# Patient Record
Sex: Male | Born: 1950 | Race: White | Hispanic: No | Marital: Married
Health system: Southern US, Community
[De-identification: ages and names within clinical notes are randomized; demographics above are authoritative.]

---

## 2016-07-01 ENCOUNTER — Inpatient Hospital Stay
Admission: AD | Admit: 2016-07-01 | Discharge: 2016-08-08 | Disposition: A | Discharge status: Rehabilitation Facility | Payer: Self-pay | Source: Other Acute Inpatient Hospital | Attending: Internal Medicine | Admitting: Internal Medicine

## 2016-07-01 ENCOUNTER — Other Ambulatory Visit (HOSPITAL_COMMUNITY): Payer: Self-pay

## 2016-07-01 DIAGNOSIS — J9690 Respiratory failure, unspecified, unspecified whether with hypoxia or hypercapnia: Secondary | ICD-10-CM

## 2016-07-01 DIAGNOSIS — Z4659 Encounter for fitting and adjustment of other gastrointestinal appliance and device: Secondary | ICD-10-CM

## 2016-07-01 DIAGNOSIS — Z0189 Encounter for other specified special examinations: Secondary | ICD-10-CM

## 2016-07-02 ENCOUNTER — Other Ambulatory Visit (HOSPITAL_COMMUNITY): Payer: Self-pay

## 2016-07-02 LAB — BLOOD GAS, ARTERIAL
Acid-base deficit: 3.1 mmol/L — ABNORMAL HIGH (ref 0.0–2.0)
BICARBONATE: 22.2 mmol/L (ref 20.0–28.0)
Drawn by: 244851
FIO2: 30
LHR: 16 {breaths}/min
O2 SAT: 95.4 %
PEEP/CPAP: 8 cmH2O
PH ART: 7.313 — AB (ref 7.350–7.450)
Patient temperature: 98.6
Pressure control: 20 cmH2O
pCO2 arterial: 45.1 mmHg (ref 32.0–48.0)
pO2, Arterial: 81.1 mmHg — ABNORMAL LOW (ref 83.0–108.0)

## 2016-07-02 LAB — COMPREHENSIVE METABOLIC PANEL
ALBUMIN: 2.2 g/dL — AB (ref 3.5–5.0)
ALK PHOS: 39 U/L (ref 38–126)
ALT: 29 U/L (ref 17–63)
ALT: 28 U/L (ref 17–63)
AST: 23 U/L (ref 15–41)
AST: 23 U/L (ref 15–41)
Albumin: 2.2 g/dL — ABNORMAL LOW (ref 3.5–5.0)
Alkaline Phosphatase: 37 U/L — ABNORMAL LOW (ref 38–126)
Anion gap: 5 (ref 5–15)
Anion gap: 6 (ref 5–15)
BILIRUBIN TOTAL: 0.6 mg/dL (ref 0.3–1.2)
BILIRUBIN TOTAL: 0.5 mg/dL (ref 0.3–1.2)
BUN: 31 mg/dL — AB (ref 6–20)
BUN: 28 mg/dL — AB (ref 6–20)
CALCIUM: 7.8 mg/dL — AB (ref 8.9–10.3)
CO2: 22 mmol/L (ref 22–32)
CO2: 21 mmol/L — ABNORMAL LOW (ref 22–32)
CREATININE: 0.73 mg/dL (ref 0.61–1.24)
Calcium: 7.8 mg/dL — ABNORMAL LOW (ref 8.9–10.3)
Chloride: 113 mmol/L — ABNORMAL HIGH (ref 101–111)
Chloride: 113 mmol/L — ABNORMAL HIGH (ref 101–111)
Creatinine, Ser: 0.75 mg/dL (ref 0.61–1.24)
GFR calc Af Amer: >60 mL/min (ref >60)
GFR calc Af Amer: >60 mL/min (ref >60)
GLUCOSE: 281 mg/dL — AB (ref 65–99)
Glucose, Bld: 266 mg/dL — ABNORMAL HIGH (ref 65–99)
POTASSIUM: 4.7 mmol/L (ref 3.5–5.1)
Potassium: 4.9 mmol/L (ref 3.5–5.1)
Sodium: 140 mmol/L (ref 135–145)
Sodium: 140 mmol/L (ref 135–145)
TOTAL PROTEIN: 4.7 g/dL — AB (ref 6.5–8.1)
TOTAL PROTEIN: 4.7 g/dL — AB (ref 6.5–8.1)

## 2016-07-02 LAB — PROTIME-INR
INR: 1.13
INR: 1.18
PROTHROMBIN TIME: 14.6 s (ref 11.4–15.2)
Prothrombin Time: 15.1 seconds (ref 11.4–15.2)

## 2016-07-02 LAB — CBC
HEMATOCRIT: 26.5 % — AB (ref 39.0–52.0)
Hemoglobin: 7.7 g/dL — ABNORMAL LOW (ref 13.0–17.0)
MCH: 24.8 pg — AB (ref 26.0–34.0)
MCHC: 29.1 g/dL — AB (ref 30.0–36.0)
MCV: 85.2 fL (ref 78.0–100.0)
PLATELETS: 119 10*3/uL — AB (ref 150–400)
RBC: 3.11 MIL/uL — ABNORMAL LOW (ref 4.22–5.81)
RDW: 17.6 % — AB (ref 11.5–15.5)
WBC: 10.8 10*3/uL — AB (ref 4.0–10.5)

## 2016-07-02 LAB — TSH: TSH: 1.643 u[IU]/mL (ref 0.350–4.500)

## 2016-07-04 ENCOUNTER — Other Ambulatory Visit (HOSPITAL_COMMUNITY): Payer: Self-pay

## 2016-07-06 ENCOUNTER — Encounter
Admission: AD | Disposition: A | Discharge status: Rehabilitation Facility | Payer: Self-pay | Attending: Internal Medicine

## 2016-07-06 ENCOUNTER — Encounter (HOSPITAL_COMMUNITY): Payer: Self-pay | Admitting: Anesthesiology

## 2016-07-06 ENCOUNTER — Other Ambulatory Visit (HOSPITAL_COMMUNITY): Payer: Self-pay

## 2016-07-06 HISTORY — PX: TRACHEOSTOMY TUBE PLACEMENT: SHX814

## 2016-07-06 SURGERY — CREATION, TRACHEOSTOMY
Anesthesia: General | Site: Neck

## 2016-07-06 MED ORDER — LIDOCAINE HCL (CARDIAC) 20 MG/ML IV SOLN
INTRAVENOUS | Status: DC | PRN
  Administered 2016-07-06: 60 mg via INTRAVENOUS

## 2016-07-06 MED ORDER — LACTATED RINGERS IV SOLN
INTRAVENOUS | Status: DC | PRN
  Administered 2016-07-06: 14:00:00 via INTRAVENOUS

## 2016-07-06 MED ORDER — 0.9 % SODIUM CHLORIDE (POUR BTL) OPTIME
TOPICAL | Status: DC | PRN
  Administered 2016-07-06: 1000 mL

## 2016-07-06 MED ORDER — FENTANYL CITRATE (PF) 250 MCG/5ML IJ SOLN
INTRAMUSCULAR | Status: AC
  Filled 2016-07-06: qty 5

## 2016-07-06 MED ORDER — MIDAZOLAM HCL 2 MG/2ML IJ SOLN
INTRAMUSCULAR | Status: AC
Start: 2016-07-06 — End: 2016-07-06
  Filled 2016-07-06: qty 2

## 2016-07-06 MED ORDER — LIDOCAINE-EPINEPHRINE 1 %-1:100000 IJ SOLN
INTRAMUSCULAR | Status: DC | PRN
  Administered 2016-07-06: 7 mL

## 2016-07-06 MED ORDER — PROPOFOL 10 MG/ML IV BOLUS
INTRAVENOUS | Status: AC
  Filled 2016-07-06: qty 20

## 2016-07-06 MED ORDER — PROPOFOL 10 MG/ML IV BOLUS
INTRAVENOUS | Status: DC | PRN
  Administered 2016-07-06: 40 mg via INTRAVENOUS

## 2016-07-06 MED ORDER — ROCURONIUM BROMIDE 100 MG/10ML IV SOLN
INTRAVENOUS | Status: DC | PRN
  Administered 2016-07-06: 20 mg via INTRAVENOUS

## 2016-07-06 MED ORDER — FENTANYL CITRATE (PF) 100 MCG/2ML IJ SOLN
INTRAMUSCULAR | Status: DC | PRN
  Administered 2016-07-06 (×2): 50 ug via INTRAVENOUS

## 2016-07-06 SURGICAL SUPPLY — 40 items
ATTRACTOMAT 16X20 MAGNETIC DRP (DRAPES) IMPLANT
BLADE SURG 15 STRL LF DISP TIS (BLADE) ×1 IMPLANT
BLADE SURG 15 STRL SS (BLADE) ×2
CLEANER TIP ELECTROSURG 2X2 (MISCELLANEOUS) ×3 IMPLANT
COVER SURGICAL LIGHT HANDLE (MISCELLANEOUS) ×3 IMPLANT
DRAPE HALF SHEET 40X57 (DRAPES) IMPLANT
ELECT COATED BLADE 2.86 ST (ELECTRODE) ×3 IMPLANT
ELECT REM PT RETURN 9FT ADLT (ELECTROSURGICAL) ×3
ELECTRODE REM PT RTRN 9FT ADLT (ELECTROSURGICAL) ×1 IMPLANT
GAUZE SPONGE 4X4 16PLY XRAY LF (GAUZE/BANDAGES/DRESSINGS) ×3 IMPLANT
GEL ULTRASOUND 20GR AQUASONIC (MISCELLANEOUS) ×3 IMPLANT
GLOVE SS BIOGEL STRL SZ 7.5 (GLOVE) ×1 IMPLANT
GLOVE SUPERSENSE BIOGEL SZ 7.5 (GLOVE) ×2
GOWN STRL REUS W/ TWL LRG LVL3 (GOWN DISPOSABLE) ×1 IMPLANT
GOWN STRL REUS W/ TWL XL LVL3 (GOWN DISPOSABLE) ×1 IMPLANT
GOWN STRL REUS W/TWL LRG LVL3 (GOWN DISPOSABLE) ×2
GOWN STRL REUS W/TWL XL LVL3 (GOWN DISPOSABLE) ×2
HOLDER TRACH TUBE VELCRO 19.5 (MISCELLANEOUS) ×3 IMPLANT
KIT BASIN OR (CUSTOM PROCEDURE TRAY) ×3 IMPLANT
KIT ROOM TURNOVER OR (KITS) ×3 IMPLANT
KIT SUCTION CATH 14FR (SUCTIONS) ×3 IMPLANT
NEEDLE HYPO 25GX1X1/2 BEV (NEEDLE) IMPLANT
NS IRRIG 1000ML POUR BTL (IV SOLUTION) ×3 IMPLANT
PACK EENT II TURBAN DRAPE (CUSTOM PROCEDURE TRAY) ×3 IMPLANT
PAD ARMBOARD 7.5X6 YLW CONV (MISCELLANEOUS) ×6 IMPLANT
PENCIL BUTTON HOLSTER BLD 10FT (ELECTRODE) ×3 IMPLANT
SPONGE DRAIN TRACH 4X4 STRL 2S (GAUZE/BANDAGES/DRESSINGS) ×3 IMPLANT
SPONGE INTESTINAL PEANUT (DISPOSABLE) ×3 IMPLANT
SUT SILK 2 0 SH CR/8 (SUTURE) ×3 IMPLANT
SUT SILK 3 0 TIES 10X30 (SUTURE) IMPLANT
SYR 5ML LUER SLIP (SYRINGE) ×3 IMPLANT
SYR CONTROL 10ML LL (SYRINGE) ×3 IMPLANT
TOWEL OR 17X24 6PK STRL BLUE (TOWEL DISPOSABLE) ×3 IMPLANT
TOWEL OR 17X26 10 PK STRL BLUE (TOWEL DISPOSABLE) ×3 IMPLANT
TUBE CONNECTING 12'X1/4 (SUCTIONS) ×1
TUBE CONNECTING 12X1/4 (SUCTIONS) ×2 IMPLANT
TUBE TRACH 7.0 EXL PROX CUF (TUBING) ×3 IMPLANT
TUBE TRACH SHILEY  6 DIST  CUF (TUBING) IMPLANT
TUBE TRACH SHILEY 10 DIST CUFF (TUBING) IMPLANT
TUBE TRACH SHILEY 8 DIST CUF (TUBING) IMPLANT

## 2016-07-06 NOTE — H&P (Signed)
PREOPERATIVE H&P  Chief Complaint: respiratory failure  HPI: Joe Norton is a 66 y.o. male who presents for evaluation of respiratory failure. Patient admitted to outside hospital in respiratory failure and CHF. Patient with history of COPD, OSA,and alcoholism. Patient was subsequently intubated over 2 weeks ago. He failed extubation x 2. Last reintubation was 11 days ago. He was subsequently transferred to [facility] 5 days ago intubated on vent. It was recommended he have a tracheostomy placed.  No past medical history on file. No past surgical history on file. Social History   Social History  . Marital status: Married    Spouse name: N/A  . Number of children: N/A  . Years of education: N/A   Social History Main Topics  . Smoking status: Not on file  . Smokeless tobacco: Not on file  . Alcohol use Not on file  . Drug use: Unknown  . Sexual activity: Not on file   Other Topics Concern  . Not on file   Social History Narrative  . No narrative on file   No family history on file. Allergies  Allergen Reactions  . Tiotropium Hives and Rash  . Tiotropium Bromide Monohydrate Hives and Rash   Prior to Admission medications   Not on File     Positive ROS: intubated on vent  All other systems have been reviewed and were otherwise negative with the exception of those mentioned in the HPI and as above.  Physical Exam: There were no vitals filed for this visit.  General: Intubated on vent. Discussed trach with patient Oral: Intubated Nasal: Clear nasal passages Neck: No palpable adenopathy or thyroid nodules. Obese neck with trach midline Cardiovascular: Regular rate and rhythm, no murmur.  Respiratory: Clear to auscultation Neurologic: Alert    Assessment/Plan: RESPIRATORY FAILURE Plan for Procedure(s): TRACHEOSTOMY   Dillard Cannon, MD 07/06/2016 1:27 PM

## 2016-07-06 NOTE — Brief Op Note (Signed)
07/01/2016 - 07/06/2016  2:34 PM  PATIENT:  Joe Norton  66 y.o. male  PRE-OPERATIVE DIAGNOSIS:  RESPIRATORY FAILURE  POST-OPERATIVE DIAGNOSIS:  RESPIRATORY FAILURE  PROCEDURE:  Procedure(s): TRACHEOSTOMY (N/A) 70 XLT tracheostomy tube  SURGEON:  Surgeon(s) and Role:    * Drema Halon, MD - Primary  PHYSICIAN ASSISTANT:   ASSISTANTS: none   ANESTHESIA:   general  EBL:  No intake/output data recorded.  BLOOD ADMINISTERED:none  DRAINS: none   LOCAL MEDICATIONS USED:  XYLOCAINE with EPI 6 cc  SPECIMEN:  No Specimen  DISPOSITION OF SPECIMEN:  N/A  COUNTS:  YES  TOURNIQUET:  * No tourniquets in log *  DICTATION: .Other Dictation: Dictation Number [id number]  PLAN OF CARE: Discharge to home after PACU  PATIENT DISPOSITION:  PACU - hemodynamically stable.   Delay start of Pharmacological VTE agent (>24hrs) due to surgical blood loss or risk of bleeding: yes

## 2016-07-06 NOTE — Interval H&P Note (Signed)
History and Physical Interval Note:  07/06/2016 1:37 PM  Joe Norton  has presented today for surgery, with the diagnosis of RESPIRATORY FAILURE  The various methods of treatment have been discussed with the patient and family. After consideration of risks, benefits and other options for treatment, the patient has consented to  Procedure(s): TRACHEOSTOMY (N/A) as a surgical intervention .  The patient's history has been reviewed, patient examined, no change in status, stable for surgery.  I have reviewed the patient's chart and labs.  Questions were answered to the patient's satisfaction.     Haruko Mersch

## 2016-07-06 NOTE — Anesthesia Preprocedure Evaluation (Signed)
Anesthesia Evaluation  Patient identified by MRN, date of birth, ID band Patient unresponsive    Reviewed: NPO status , Unable to perform ROS - Chart review only  Airway Mallampati: Intubated       Dental no notable dental hx.    Pulmonary COPD,  VDRF   + rhonchi  + decreased breath sounds      Cardiovascular + CAD, + CABG and +CHF   Rhythm:Irregular Rate:Tachycardia     Neuro/Psych    GI/Hepatic   Endo/Other  Morbid obesity  Renal/GU      Musculoskeletal   Abdominal (+) + obese,   Peds  Hematology   Anesthesia Other Findings   Reproductive/Obstetrics                             Anesthesia Physical Anesthesia Plan  ASA: IV  Anesthesia Plan: General   Post-op Pain Management:    Induction: Intravenous  Airway Management Planned: Tracheostomy  Additional Equipment:   Intra-op Plan:   Post-operative Plan: Post-operative intubation/ventilation  Informed Consent: I have reviewed the patients History and Physical, chart, labs and discussed the procedure including the risks, benefits and alternatives for the proposed anesthesia with the patient or authorized representative who has indicated his/her understanding and acceptance.   History available from chart only  Plan Discussed with:   Anesthesia Plan Comments:         Anesthesia Quick Evaluation

## 2016-07-06 NOTE — Anesthesia Procedure Notes (Signed)
Date/Time: 07/06/2016 1:52 PM Performed by: Fransisca Kaufmann Pre-anesthesia Checklist: Patient identified, Emergency Drugs available, Suction available and Timeout performed Patient Re-evaluated:Patient Re-evaluated prior to inductionOxygen Delivery Method: Circle system utilized Intubation Type: IV induction and Tracheostomy Comments: Anesthesia circuit attached to ETT per Dr Jacklynn Bue

## 2016-07-07 ENCOUNTER — Encounter (HOSPITAL_COMMUNITY): Payer: Self-pay | Admitting: Otolaryngology

## 2016-07-07 ENCOUNTER — Encounter (HOSPITAL_BASED_OUTPATIENT_CLINIC_OR_DEPARTMENT_OTHER): Payer: Self-pay

## 2016-07-07 DIAGNOSIS — M7989 Other specified soft tissue disorders: Secondary | ICD-10-CM

## 2016-07-07 NOTE — Progress Notes (Signed)
VASCULAR LAB PRELIMINARY  PRELIMINARY  PRELIMINARY  PRELIMINARY  Right upper extremity venous duplex completed.    Preliminary report:  There is no DVT or SVT noted in the right upper extremity.   Reegan Mctighe, RVT 07/07/2016, 1:58 PM

## 2016-07-07 NOTE — Op Note (Signed)
NAME:  Joe Norton, Joe Norton                  ACCOUNT NO.:  MEDICAL RECORD NO.:  [id number]  LOCATION:                                 FACILITY:  PHYSICIAN:  Kristine Garbe. Ezzard Standing, M.D. DATE OF BIRTH:  DATE OF PROCEDURE:  07/06/2016 DATE OF DISCHARGE:                              OPERATIVE REPORT   PREOPERATIVE DIAGNOSIS:  Acute respiratory failure on ventilator.  POSTOPERATIVE DIAGNOSIS:  Acute respiratory failure on ventilator.  OPERATION PERFORMED:  Tracheostomy with a 70 Proximal XLT tracheostomy tube.  SURGEON:  Kristine Garbe. Ezzard Standing, M.D.  ANESTHESIA:  General endotracheal.  COMPLICATIONS:  None.  BRIEF CLINICAL NOTE:  Joe Norton is a 66 year old gentleman with history of congestive heart failure, COPD, obstructive sleep apnea, hypertension, and history of alcoholism.  He was admitted to an outside hospital about 3 weeks ago in respiratory distress and was intubated. Two attempts of extubation were unsuccessful requiring last intubation about 10 days ago.  The patient was subsequently transferred to [facility] 4 days ago, intubated on ventilator.  The patient is very obese with a very thick neck, history of obstructive sleep apnea, as well as COPD and congestive heart failure.  It was recommended that he undergo tracheotomy.  He was taken to the operating room this time for tracheostomy.  DESCRIPTION OF PROCEDURE:  After adequate endotracheal anesthesia and IV sedation, the neck was extended, prepped with Betadine solution, and draped out with sterile towels.  The proposed incision site was injected with 6 mL of Xylocaine with epinephrine for local anesthetic and hemostasis.  A vertical incision was then made just above the suprasternal notch down through the subcutaneous tissue, the patient has significant amount of subcutaneous fat that was cauterized.  The strap muscles were divided and retracted in midline.  Dissection was carried down to the  cricoid cartilage.  This was fairly low in the neck.  The first 2 tracheal rings were exposed with retraction and cautery and the upper portion of the thyroid isthmus was partly cauterized.  A horizontal incision was made between the first and second tracheal rings.  The endotracheal tube was removed and a 70 proximal XLT tracheostomy tube was inserted without any difficulty.  The patient was ventilated well.  This was secured with 2-0 silk sutures x4 and Velcro trach collar around the neck.  The patient was subsequently transferred back to [facility].          ______________________________ Kristine Garbe. Ezzard Standing, M.D.     CEN/MEDQ  D:  07/06/2016  T:  07/06/2016  Job:  [id number]

## 2016-07-08 ENCOUNTER — Encounter (HOSPITAL_COMMUNITY): Payer: Self-pay | Admitting: Otolaryngology

## 2016-07-08 NOTE — Anesthesia Postprocedure Evaluation (Addendum)
Anesthesia Post Note  Patient: Joe Norton  Procedure(s) Performed: Procedure(s) (LRB): TRACHEOSTOMY (N/A)  Patient location during evaluation: PACU Anesthesia Type: General Level of consciousness: sedated and patient remains intubated per anesthesia plan Pain management: pain level controlled Vital Signs Assessment: post-procedure vital signs reviewed and stable Respiratory status: spontaneous breathing, nonlabored ventilation, respiratory function stable and patient connected to nasal cannula oxygen Cardiovascular status: blood pressure returned to baseline and stable Postop Assessment: no signs of nausea or vomiting Anesthetic complications: no       Last Vitals: There were no vitals filed for this visit.  Last Pain: There were no vitals filed for this visit.               Harding Thomure,JAMES TERRILL

## 2016-07-08 NOTE — Anesthesia Postprocedure Evaluation (Signed)
Anesthesia Post Note  Patient: Joe Norton  Procedure(s) Performed: Procedure(s) (LRB): TRACHEOSTOMY (N/A)  Anesthesia Post Evaluation     Last Vitals: There were no vitals filed for this visit.  Last Pain: There were no vitals filed for this visit.               Alvan Dame

## 2016-07-08 NOTE — Transfer of Care (Signed)
Immediate Anesthesia Transfer of Care Note  Patient: Joe Norton  Procedure(s) Performed: Procedure(s): TRACHEOSTOMY (N/A)  Patient Location: PACU and Nursing Unit  Anesthesia Type:General  Level of Consciousness: awake and alert   Airway & Oxygen Therapy: Patient Spontanous Breathing and Patient connected to tracheostomy mask oxygen  Post-op Assessment: Report given to RN, Post -op Vital signs reviewed and stable and Patient moving all extremities  Post vital signs: Reviewed and stable  Last Vitals: There were no vitals filed for this visit.  Last Pain: There were no vitals filed for this visit.       Complications: No apparent anesthesia complications

## 2016-07-09 LAB — MAGNESIUM: MAGNESIUM: 1.5 mg/dL — AB (ref 1.7–2.4)

## 2016-07-09 LAB — BASIC METABOLIC PANEL
ANION GAP: 7 (ref 5–15)
BUN: 29 mg/dL — ABNORMAL HIGH (ref 6–20)
CHLORIDE: 105 mmol/L (ref 101–111)
CO2: 23 mmol/L (ref 22–32)
Calcium: 7.2 mg/dL — ABNORMAL LOW (ref 8.9–10.3)
Creatinine, Ser: 0.72 mg/dL (ref 0.61–1.24)
GFR calc Af Amer: >60 mL/min (ref >60)
GFR calc non Af Amer: >60 mL/min (ref >60)
GLUCOSE: 275 mg/dL — AB (ref 65–99)
POTASSIUM: 4.9 mmol/L (ref 3.5–5.1)
Sodium: 135 mmol/L (ref 135–145)

## 2016-07-10 LAB — BASIC METABOLIC PANEL
Anion gap: 8 (ref 5–15)
BUN: 29 mg/dL — AB (ref 6–20)
CHLORIDE: 104 mmol/L (ref 101–111)
CO2: 23 mmol/L (ref 22–32)
CREATININE: 0.56 mg/dL — AB (ref 0.61–1.24)
Calcium: 6.7 mg/dL — ABNORMAL LOW (ref 8.9–10.3)
GFR calc Af Amer: >60 mL/min (ref >60)
GFR calc non Af Amer: >60 mL/min (ref >60)
Glucose, Bld: 205 mg/dL — ABNORMAL HIGH (ref 65–99)
Potassium: 4.9 mmol/L (ref 3.5–5.1)
SODIUM: 135 mmol/L (ref 135–145)

## 2016-07-10 LAB — MAGNESIUM: MAGNESIUM: 1.8 mg/dL (ref 1.7–2.4)

## 2016-07-11 LAB — HEMOGLOBIN A1C
HEMOGLOBIN A1C: 6.8 % — AB (ref 4.8–5.6)
Mean Plasma Glucose: 148 mg/dL

## 2016-07-14 ENCOUNTER — Other Ambulatory Visit (HOSPITAL_COMMUNITY): Payer: Self-pay

## 2016-07-15 LAB — BASIC METABOLIC PANEL
Anion gap: 8 (ref 5–15)
BUN: 21 mg/dL — ABNORMAL HIGH (ref 6–20)
CALCIUM: 7.1 mg/dL — AB (ref 8.9–10.3)
CHLORIDE: 100 mmol/L — AB (ref 101–111)
CO2: 32 mmol/L (ref 22–32)
Creatinine, Ser: 0.47 mg/dL — ABNORMAL LOW (ref 0.61–1.24)
GFR calc non Af Amer: >60 mL/min (ref >60)
Glucose, Bld: 124 mg/dL — ABNORMAL HIGH (ref 65–99)
Potassium: 3.5 mmol/L (ref 3.5–5.1)
Sodium: 140 mmol/L (ref 135–145)

## 2016-07-15 LAB — CBC
HEMATOCRIT: 30.1 % — AB (ref 39.0–52.0)
HEMOGLOBIN: 8.8 g/dL — AB (ref 13.0–17.0)
MCH: 25.1 pg — ABNORMAL LOW (ref 26.0–34.0)
MCHC: 29.2 g/dL — ABNORMAL LOW (ref 30.0–36.0)
MCV: 86 fL (ref 78.0–100.0)
Platelets: 230 10*3/uL (ref 150–400)
RBC: 3.5 MIL/uL — ABNORMAL LOW (ref 4.22–5.81)
RDW: 19.3 % — ABNORMAL HIGH (ref 11.5–15.5)
WBC: 7 10*3/uL (ref 4.0–10.5)

## 2016-07-16 LAB — CULTURE, RESPIRATORY

## 2016-07-16 LAB — CULTURE, RESPIRATORY W GRAM STAIN: Culture: NORMAL

## 2016-07-19 ENCOUNTER — Other Ambulatory Visit (HOSPITAL_COMMUNITY): Payer: Self-pay

## 2016-07-19 LAB — BASIC METABOLIC PANEL
ANION GAP: 8 (ref 5–15)
BUN: 10 mg/dL (ref 6–20)
CALCIUM: 7.9 mg/dL — AB (ref 8.9–10.3)
CO2: 35 mmol/L — ABNORMAL HIGH (ref 22–32)
CREATININE: 0.45 mg/dL — AB (ref 0.61–1.24)
Chloride: 98 mmol/L — ABNORMAL LOW (ref 101–111)
Glucose, Bld: 132 mg/dL — ABNORMAL HIGH (ref 65–99)
Potassium: 3.4 mmol/L — ABNORMAL LOW (ref 3.5–5.1)
Sodium: 141 mmol/L (ref 135–145)

## 2016-07-19 LAB — CBC
HCT: 28.3 % — ABNORMAL LOW (ref 39.0–52.0)
Hemoglobin: 8.5 g/dL — ABNORMAL LOW (ref 13.0–17.0)
MCH: 26.2 pg (ref 26.0–34.0)
MCHC: 30 g/dL (ref 30.0–36.0)
MCV: 87.1 fL (ref 78.0–100.0)
PLATELETS: 196 10*3/uL (ref 150–400)
RBC: 3.25 MIL/uL — ABNORMAL LOW (ref 4.22–5.81)
RDW: 20.5 % — AB (ref 11.5–15.5)
WBC: 9 10*3/uL (ref 4.0–10.5)

## 2016-07-20 LAB — POTASSIUM: Potassium: 3.4 mmol/L — ABNORMAL LOW (ref 3.5–5.1)

## 2016-07-21 ENCOUNTER — Other Ambulatory Visit (HOSPITAL_COMMUNITY): Payer: Self-pay

## 2016-07-21 LAB — POTASSIUM: Potassium: 4.3 mmol/L (ref 3.5–5.1)

## 2016-07-25 ENCOUNTER — Other Ambulatory Visit (HOSPITAL_COMMUNITY): Payer: Self-pay

## 2016-07-25 LAB — CBC
HCT: 30.3 % — ABNORMAL LOW (ref 39.0–52.0)
HEMOGLOBIN: 9 g/dL — AB (ref 13.0–17.0)
MCH: 26.5 pg (ref 26.0–34.0)
MCHC: 29.7 g/dL — AB (ref 30.0–36.0)
MCV: 89.1 fL (ref 78.0–100.0)
Platelets: 181 10*3/uL (ref 150–400)
RBC: 3.4 MIL/uL — ABNORMAL LOW (ref 4.22–5.81)
RDW: 21.3 % — AB (ref 11.5–15.5)
WBC: 7.5 10*3/uL (ref 4.0–10.5)

## 2016-07-25 LAB — BASIC METABOLIC PANEL
Anion gap: 8 (ref 5–15)
BUN: 7 mg/dL (ref 6–20)
CALCIUM: 8.4 mg/dL — AB (ref 8.9–10.3)
CHLORIDE: 96 mmol/L — AB (ref 101–111)
CO2: 38 mmol/L — ABNORMAL HIGH (ref 22–32)
CREATININE: 0.59 mg/dL — AB (ref 0.61–1.24)
GFR calc non Af Amer: >60 mL/min (ref >60)
Glucose, Bld: 103 mg/dL — ABNORMAL HIGH (ref 65–99)
Potassium: 3 mmol/L — ABNORMAL LOW (ref 3.5–5.1)
SODIUM: 142 mmol/L (ref 135–145)

## 2016-07-26 LAB — POTASSIUM: POTASSIUM: 4 mmol/L (ref 3.5–5.1)

## 2016-08-04 ENCOUNTER — Other Ambulatory Visit (HOSPITAL_COMMUNITY): Payer: Self-pay

## 2016-08-04 LAB — CBC
HEMATOCRIT: 33.2 % — AB (ref 39.0–52.0)
Hemoglobin: 9.4 g/dL — ABNORMAL LOW (ref 13.0–17.0)
MCH: 26.4 pg (ref 26.0–34.0)
MCHC: 28.3 g/dL — AB (ref 30.0–36.0)
MCV: 93.3 fL (ref 78.0–100.0)
Platelets: 275 10*3/uL (ref 150–400)
RBC: 3.56 MIL/uL — ABNORMAL LOW (ref 4.22–5.81)
RDW: 20.6 % — AB (ref 11.5–15.5)
WBC: 8 10*3/uL (ref 4.0–10.5)

## 2016-08-04 LAB — BASIC METABOLIC PANEL
ANION GAP: 6 (ref 5–15)
BUN: 9 mg/dL (ref 6–20)
CALCIUM: 8.1 mg/dL — AB (ref 8.9–10.3)
CHLORIDE: 97 mmol/L — AB (ref 101–111)
CO2: 40 mmol/L — AB (ref 22–32)
Creatinine, Ser: 0.85 mg/dL (ref 0.61–1.24)
GFR calc non Af Amer: >60 mL/min (ref >60)
GLUCOSE: 68 mg/dL (ref 65–99)
Potassium: 3 mmol/L — ABNORMAL LOW (ref 3.5–5.1)
Sodium: 143 mmol/L (ref 135–145)

## 2016-08-12 NOTE — Addendum Note (Signed)
Addendum  created 08/12/16 1420 by Patrecia Veiga, MD   Sign clinical note    

## 2016-10-12 DEATH — deceased

## 2018-11-06 IMAGING — DX DG CHEST 1V PORT
1 series · 1 of 1 positions shown · non-contrast
Comparison: None.

CLINICAL DATA: Endotracheal and NG tube placement.

EXAM:
PORTABLE CHEST 1 VIEW

[chest ap]
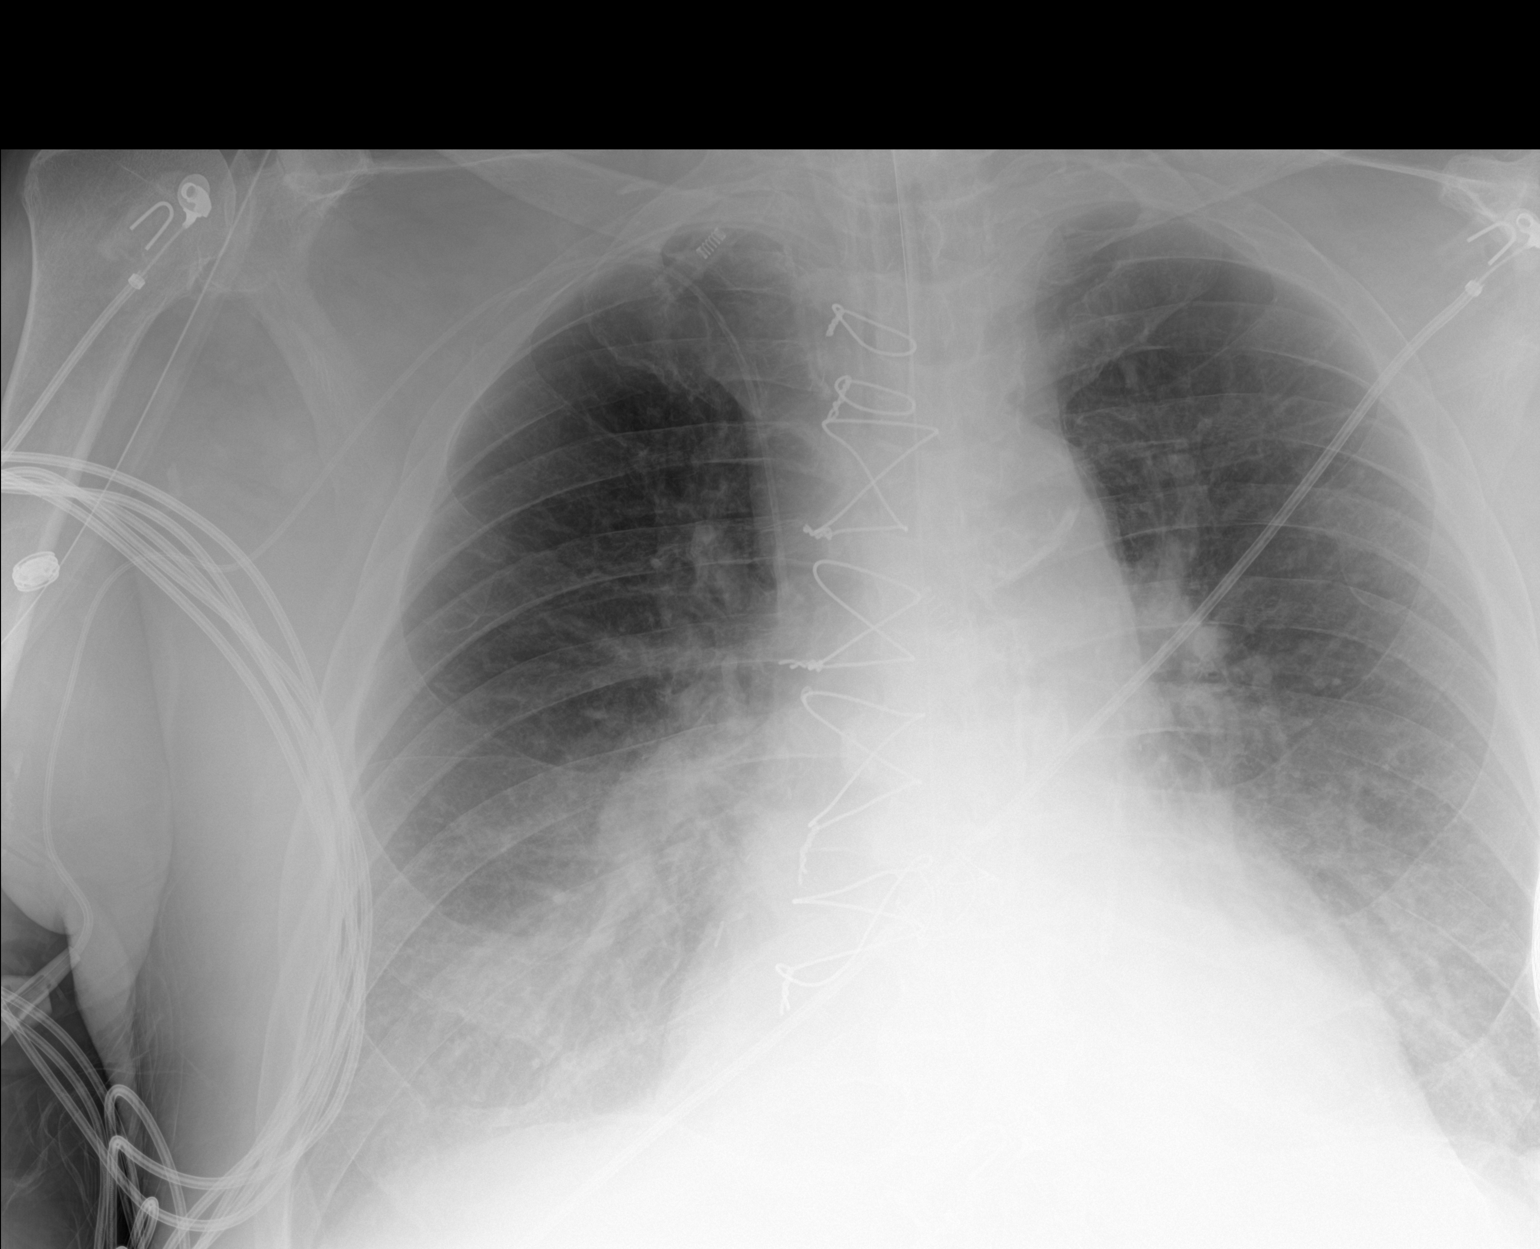

[1 of 1 positions shown; findings below may reference images not displayed]

FINDINGS: 1322 hours. Endotracheal tube tip is 5.4 cm above the base of the
carina. The NG tube passes into the stomach although the distal tip
position is not included on the film. Right PICC line tip overlies
the proximal to mid SVC. The cardio pericardial silhouette is
enlarged. Bibasilar collapse/ consolidation with probable bilateral
pleural effusions. Median sternotomy wires noted at the midline.
Telemetry leads overlie the chest.
IMPRESSION: Endotracheal tube tip 5.4 cm above the base of the carina. The NG
tube passes into the stomach although the distal tip position is not
included on the film.

## 2018-11-11 IMAGING — CR DG ABD PORTABLE 1V
1 series · 1 of 1 positions shown · non-contrast
Comparison: July 04, 2016

CLINICAL DATA: Nasogastric tube placement

EXAM:
PORTABLE ABDOMEN - 1 VIEW

[AP]
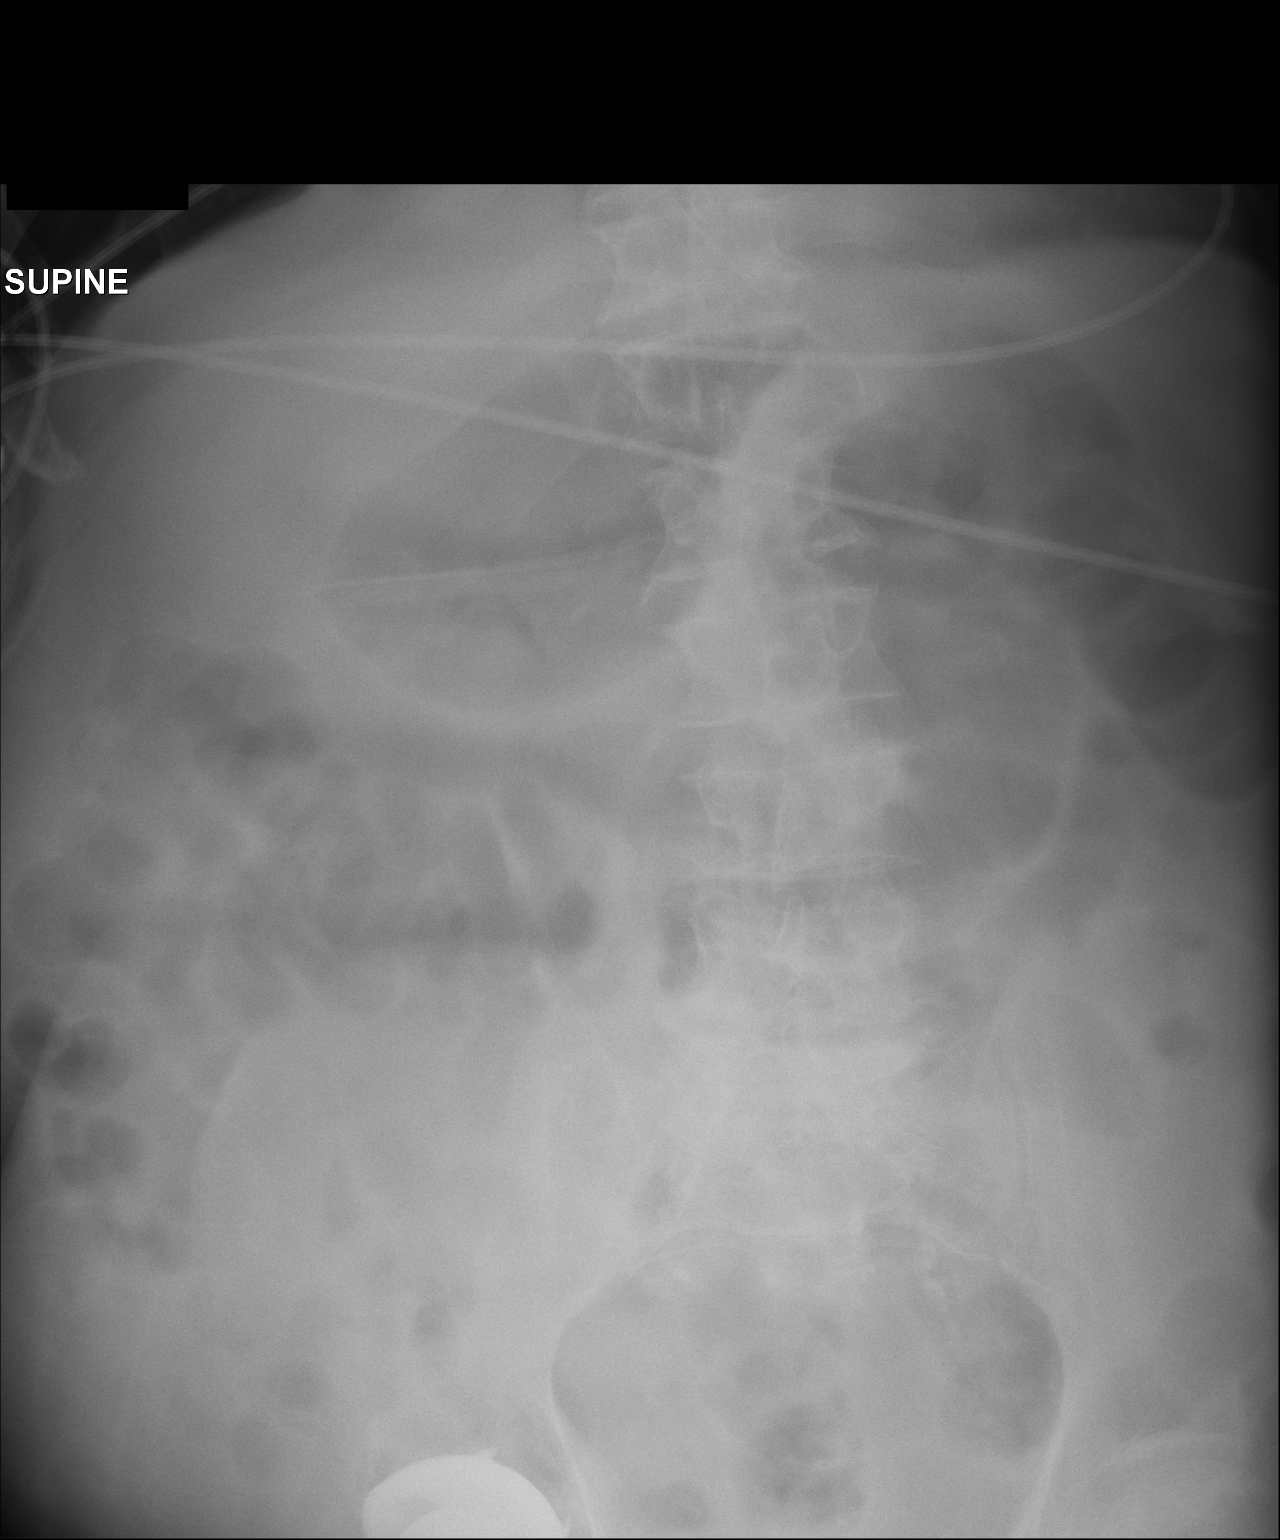

[1 of 1 positions shown; findings below may reference images not displayed]

FINDINGS: Nasogastric tube tip and side port are in the stomach. There is no
appreciable bowel dilatation or air-fluid levels suggesting bowel
obstruction. No evident free air. There is a total hip replacement
on the right.
IMPRESSION: Nasogastric tube tip and side port in stomach. Bowel gas pattern
unremarkable.

## 2018-11-19 IMAGING — DX DG CHEST 1V PORT
1 series · 1 of 1 positions shown · non-contrast
Comparison: 07/01/2016 chest radiograph.

CLINICAL DATA: 65 y/o M; increased secretions and respiratory
failure.

EXAM:
PORTABLE CHEST 1 VIEW

[chest ap]
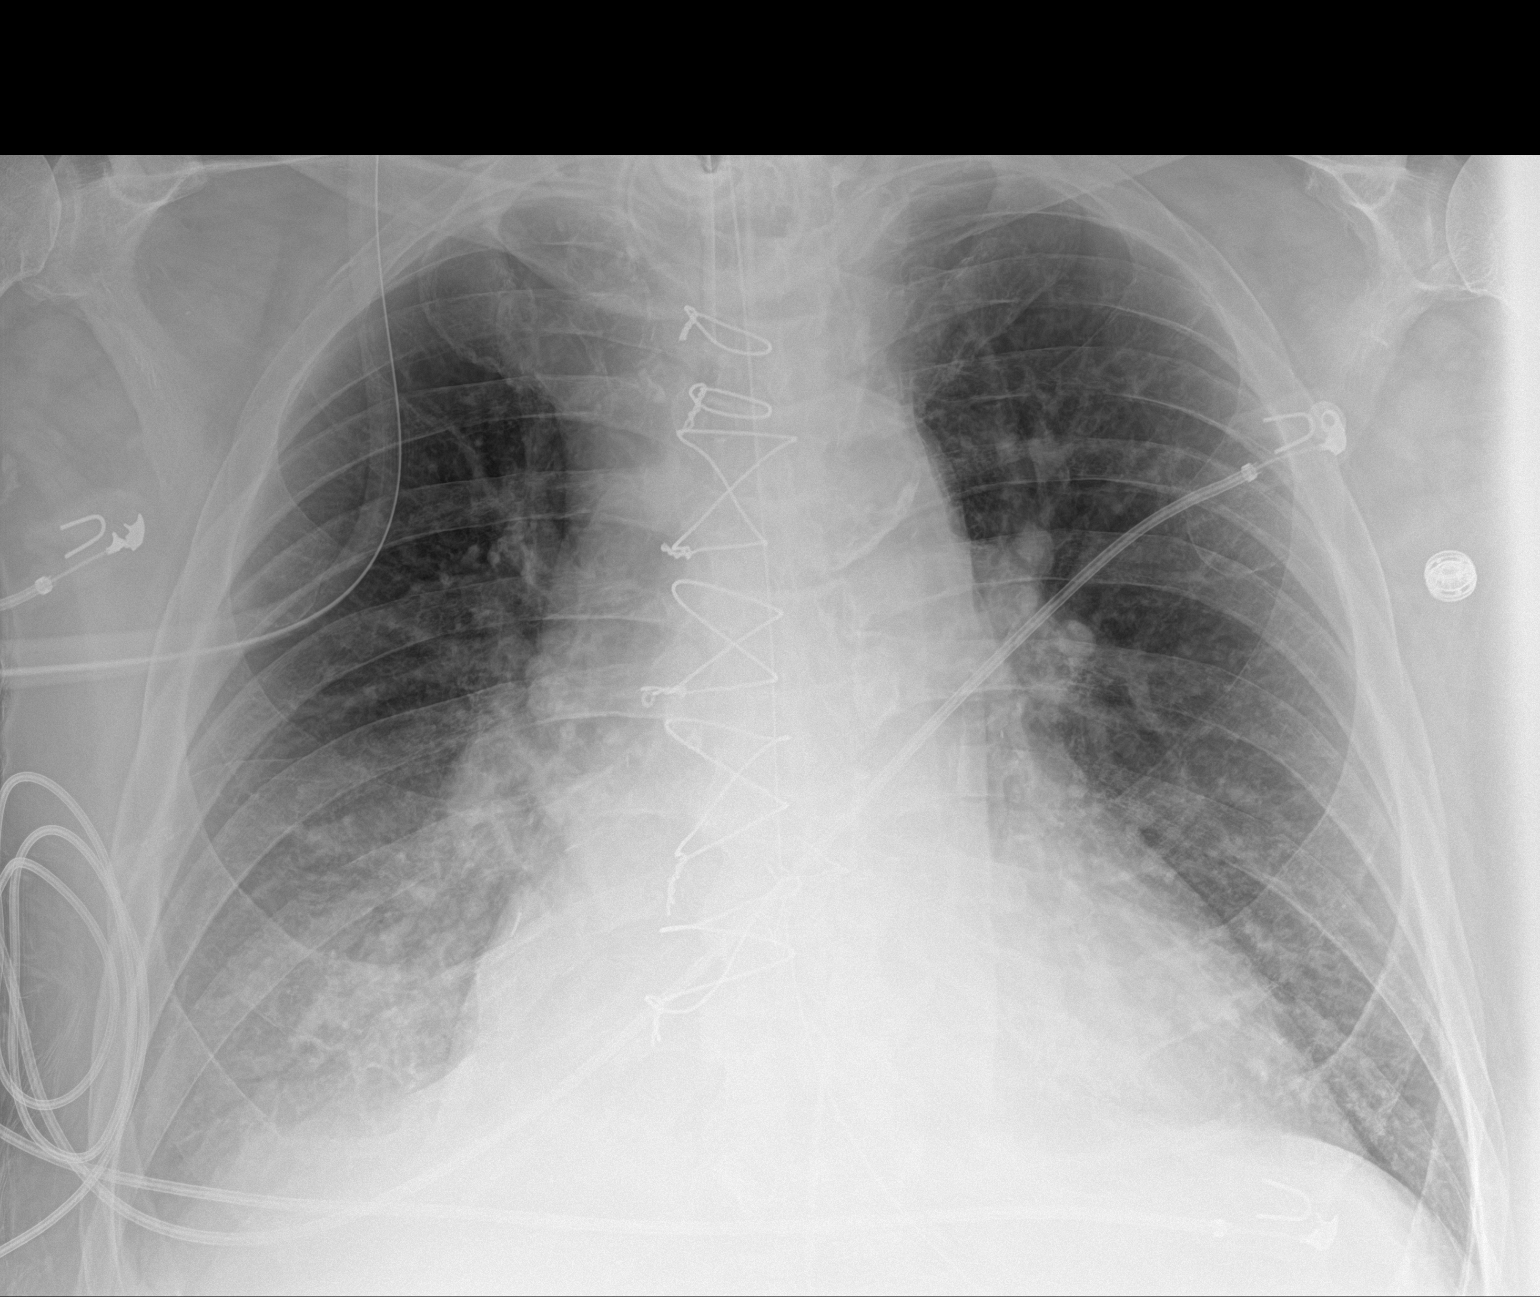

[1 of 1 positions shown; findings below may reference images not displayed]

FINDINGS: Tracheostomy tube noted. Sternotomy wires are aligned. Aortic
atherosclerosis with calcification. Enteric tube tip below the field
of view in the abdomen. Hazy opacification of right lung base.
Possible small right effusion.
IMPRESSION: Hazy opacification of right lung base may represent atelectasis or
pneumonia. Possible small right effusion.

By: Zqck Karthi M.D.
